# Patient Record
Sex: Male | Born: 1970 | Race: White | Hispanic: Yes | Marital: Married | Smoking: Never smoker
Health system: Southern US, Community
[De-identification: ages and names within clinical notes are randomized; demographics above are authoritative.]

## PROBLEM LIST (undated history)

## (undated) HISTORY — PX: APPENDECTOMY: SHX54

---

## 2006-09-26 ENCOUNTER — Inpatient Hospital Stay (HOSPITAL_COMMUNITY): Admission: EM | Admit: 2006-09-26 | Discharge: 2006-09-29 | Payer: Self-pay | Admitting: Emergency Medicine

## 2013-08-26 ENCOUNTER — Emergency Department (HOSPITAL_COMMUNITY)
Admission: EM | Admit: 2013-08-26 | Discharge: 2013-08-26 | Disposition: A | Discharge status: Home / Self Care | Payer: Self-pay | Attending: Emergency Medicine | Admitting: Emergency Medicine

## 2013-08-26 ENCOUNTER — Encounter (HOSPITAL_COMMUNITY): Payer: Self-pay | Admitting: Emergency Medicine

## 2013-08-26 DIAGNOSIS — K2970 Gastritis, unspecified, without bleeding: Secondary | ICD-10-CM | POA: Insufficient documentation

## 2013-08-26 DIAGNOSIS — Z79899 Other long term (current) drug therapy: Secondary | ICD-10-CM | POA: Insufficient documentation

## 2013-08-26 DIAGNOSIS — K2990 Gastroduodenitis, unspecified, without bleeding: Principal | ICD-10-CM

## 2013-08-26 DIAGNOSIS — Z9889 Other specified postprocedural states: Secondary | ICD-10-CM | POA: Insufficient documentation

## 2013-08-26 LAB — CBC WITH DIFFERENTIAL/PLATELET
Basophils Absolute: 0 10*3/uL (ref 0.0–0.1)
Basophils Relative: 0 % (ref 0–1)
EOS ABS: 0.1 10*3/uL (ref 0.0–0.7)
Eosinophils Relative: 2 % (ref 0–5)
HCT: 44 % (ref 39.0–52.0)
HEMOGLOBIN: 15.9 g/dL (ref 13.0–17.0)
LYMPHS ABS: 2.2 10*3/uL (ref 0.7–4.0)
LYMPHS PCT: 29 % (ref 12–46)
MCH: 29.3 pg (ref 26.0–34.0)
MCHC: 36.1 g/dL — ABNORMAL HIGH (ref 30.0–36.0)
MCV: 81 fL (ref 78.0–100.0)
MONOS PCT: 9 % (ref 3–12)
Monocytes Absolute: 0.7 10*3/uL (ref 0.1–1.0)
NEUTROS PCT: 61 % (ref 43–77)
Neutro Abs: 4.7 10*3/uL (ref 1.7–7.7)
Platelets: 199 10*3/uL (ref 150–400)
RBC: 5.43 MIL/uL (ref 4.22–5.81)
RDW: 13.1 % (ref 11.5–15.5)
WBC: 7.7 10*3/uL (ref 4.0–10.5)

## 2013-08-26 LAB — COMPREHENSIVE METABOLIC PANEL
ALK PHOS: 81 U/L (ref 39–117)
ALT: 29 U/L (ref 0–53)
AST: 29 U/L (ref 0–37)
Albumin: 4.3 g/dL (ref 3.5–5.2)
BILIRUBIN TOTAL: 0.5 mg/dL (ref 0.3–1.2)
BUN: 21 mg/dL (ref 6–23)
CHLORIDE: 98 meq/L (ref 96–112)
CO2: 29 meq/L (ref 19–32)
Calcium: 9.5 mg/dL (ref 8.4–10.5)
Creatinine, Ser: 0.81 mg/dL (ref 0.50–1.35)
GLUCOSE: 100 mg/dL — AB (ref 70–99)
POTASSIUM: 4.2 meq/L (ref 3.7–5.3)
Sodium: 140 mEq/L (ref 137–147)
Total Protein: 8 g/dL (ref 6.0–8.3)

## 2013-08-26 LAB — URINALYSIS, ROUTINE W REFLEX MICROSCOPIC
BILIRUBIN URINE: NEGATIVE
Glucose, UA: NEGATIVE mg/dL
HGB URINE DIPSTICK: NEGATIVE
KETONES UR: NEGATIVE mg/dL
Leukocytes, UA: NEGATIVE
NITRITE: NEGATIVE
Protein, ur: NEGATIVE mg/dL
Specific Gravity, Urine: 1.023 (ref 1.005–1.030)
UROBILINOGEN UA: 0.2 mg/dL (ref 0.0–1.0)
pH: 6 (ref 5.0–8.0)

## 2013-08-26 LAB — LIPASE, BLOOD: LIPASE: 32 U/L (ref 11–59)

## 2013-08-26 MED ORDER — FAMOTIDINE 20 MG PO TABS: 20.0000 mg | ORAL_TABLET | Freq: Two times a day (BID) | ORAL | Status: DC

## 2013-08-26 MED ORDER — FAMOTIDINE 20 MG PO TABS
20.0000 mg | ORAL_TABLET | Freq: Once | ORAL | Status: AC
  Administered 2013-08-26: 20 mg via ORAL
  Filled 2013-08-26: qty 1

## 2013-08-26 NOTE — ED Notes (Addendum)
Last ate 1800, last BM 4 hrs ago (normal). C/o LUQ pain and also some back pain. (Denies: nv, dizziness, fever, HA, sob, CP or other sx), alert, NAD, calm, interactive. Mentions having had appendix removed (added to hx). Lab results reviewed.

## 2013-08-26 NOTE — ED Notes (Signed)
The pt  C/o generalized mid abd pain for 6 hours no nv or diarrhea

## 2013-08-26 NOTE — ED Provider Notes (Signed)
CSN: [id number]     Arrival date & time 08/26/13  0048 History   First MD Initiated Contact with Patient 08/26/13 [phone]     Chief Complaint  Patient presents with  . Abdominal Pain     (Consider location/radiation/quality/duration/timing/severity/associated sxs/prior Treatment) HPI Comments: 43 year old male, complains of epigastric pain which started approximately 5 hours prior to my evaluation. This was gradual in onset, persistent, radiates to the mid back, burning in sensation, nothing makes this better or worse. He had similar symptoms a couple of months ago which resolved spontaneously but only lasted 2 hours at that time. He denies nausea, vomiting, diarrhea, dysuria, swelling. He had an appendectomy several years ago which was uncomplicated laparoscopic procedure. At this time his symptoms are gradually improving and had almost completely resolved. He states that he had a grilled fish for dinner, he does not usually get pain with eating  Patient is a 43 y.o. male presenting with abdominal pain. The history is provided by the patient.  Abdominal Pain   History reviewed. No pertinent past medical history. Past Surgical History  Procedure Laterality Date  . Appendectomy     No family history on file. History  Substance Use Topics  . Smoking status: Never Smoker   . Smokeless tobacco: Not on file  . Alcohol Use: No    Review of Systems  Gastrointestinal: Positive for abdominal pain.  All other systems reviewed and are negative.      Allergies  Review of patient's allergies indicates no known allergies.  Home Medications   Current Outpatient Rx  Name  Route  Sig  Dispense  Refill  . acetaminophen (TYLENOL) 500 MG tablet   Oral   Take 1,000 mg by mouth every 6 (six) hours as needed for moderate pain.         . famotidine (PEPCID) 20 MG tablet   Oral   Take 1 tablet (20 mg total) by mouth 2 (two) times daily.   30 tablet   0    BP 128/84  Pulse 61  Temp(Src)  97.9 F (36.6 C) (Oral)  Resp 18  Ht 5\' 6"  (1.676 m)  Wt 172 lb 9.6 oz (78.291 kg)  BMI 27.87 kg/m2  SpO2 99% Physical Exam  Nursing note and vitals reviewed. Constitutional: He appears well-developed and well-nourished. No distress.  HENT:  Head: Normocephalic and atraumatic.  Mouth/Throat: Oropharynx is clear and moist. No oropharyngeal exudate.  Eyes: Conjunctivae and EOM are normal. Pupils are equal, round, and reactive to light. Right eye exhibits no discharge. Left eye exhibits no discharge. No scleral icterus.  Neck: Normal range of motion. Neck supple. No JVD present. No thyromegaly present.  Cardiovascular: Normal rate, regular rhythm, normal heart sounds and intact distal pulses.  Exam reveals no gallop and no friction rub.   No murmur heard. Pulmonary/Chest: Effort normal and breath sounds normal. No respiratory distress. He has no wheezes. He has no rales.  Abdominal: Soft. Bowel sounds are normal. He exhibits no distension and no mass. There is tenderness ( Minimal epigastric and left upper quadrant tenderness, no distention, no guarding, no other tenderness including right upper quadrant.).  Musculoskeletal: Normal range of motion. He exhibits no edema and no tenderness.  Lymphadenopathy:    He has no cervical adenopathy.  Neurological: He is alert. Coordination normal.  Skin: Skin is warm and dry. No rash noted. No erythema.  Psychiatric: He has a normal mood and affect. His behavior is normal.    ED Course  Procedures (including critical care time) Labs Review Labs Reviewed  CBC WITH DIFFERENTIAL - Abnormal; Notable for the following:    MCHC 36.1 (*)    All other components within normal limits  COMPREHENSIVE METABOLIC PANEL - Abnormal; Notable for the following:    Glucose, Bld 100 (*)    All other components within normal limits  LIPASE, BLOOD  URINALYSIS, ROUTINE W REFLEX MICROSCOPIC   Imaging Review No results found.    MDM   Final diagnoses:   Gastritis    The patient is very well-appearing, normal GU exam, abdominal exam is benign and likely consistent with a gastritis or early peptic ulcer. His vital signs are normal, laboratory workup shows normal white blood cell count at 7700, normal cover his metabolic panel and normal lipase. The patient will be started on Pepcid, given precautions against using anti-inflammatory medications, followup encouraged her family doctor. Patient expresses understanding.   Meds given in ED:  Medications  famotidine (PEPCID) tablet 20 mg (not administered)    New Prescriptions   FAMOTIDINE (PEPCID) 20 MG TABLET    Take 1 tablet (20 mg total) by mouth 2 (two) times daily.        Vida Roller, MD 08/26/13 [phone]

## 2013-08-26 NOTE — Discharge Instructions (Signed)
°Emergency Department Resource Guide °1) Find a Doctor and Pay Out of Pocket °Although you won't have to find out who is covered by your insurance plan, it is a good idea to ask around and get recommendations. You will then need to call the office and see if the doctor you have chosen will accept you as a new patient and what types of options they offer for patients who are self-pay. Some doctors offer discounts or will set up payment plans for their patients who do not have insurance, but you will need to ask so you aren't surprised when you get to your appointment. ° °2) Contact Your Local Health Department °Not all health departments have doctors that can see patients for sick visits, but many do, so it is worth a call to see if yours does. If you don't know where your local health department is, you can check in your phone book. The CDC also has a tool to help you locate your state's health department, and many state websites also have listings of all of their local health departments. ° °3) Find a Walk-in Clinic °If your illness is not likely to be very severe or complicated, you may want to try a walk in clinic. These are popping up all over the country in pharmacies, drugstores, and shopping centers. They're usually staffed by nurse practitioners or physician assistants that have been trained to treat common illnesses and complaints. They're usually fairly quick and inexpensive. However, if you have serious medical issues or chronic medical problems, these are probably not your best option. ° °No Primary Care Doctor: °- Call [facility] at  832-8000 - they can help you locate a primary care doctor that  accepts your insurance, provides certain services, etc. °- Physician Referral Service- 1-800-533-3463 ° °Chronic Pain Problems: °Organization         Address  Phone   Notes  °[facility]  (336) 297-2271 Patients need to be referred by their primary care doctor.  ° °Medication  Assistance: °Organization         Address  Phone   Notes  °[place] [facility] [address] °[place], [place] [address] (336) 641-8030 --Must be a resident of [place] °-- Must have NO insurance coverage whatsoever (no Medicaid/ Medicare, etc.) °-- The pt. MUST have a primary care doctor that directs their care regularly and follows them in the community °  °[facility]  (866) 331-1348   °[employer]  (888) 892-1162   ° °Agencies that provide inexpensive medical care: °Organization         Address  Phone   Notes  °[facility]  (336) 832-8035   °[facility]    (336) 832-7272   °[facility] [address] °[place], [place] [address] (336) 832-4777   °[facility] [address], °[place] (336) 271-4999   °[facility]    (336) 373-0678   °[facility]    (336) 272-1050   °[facility] ° [address], [place] Phone:  (336) 832-4444, Fax:  (336) 832-4440 Hours of Operation:  9 am - 6 pm, M-F.  Also accepts Medicaid/Medicare and self-pay.  °[facility] ° [address],  Phone: (336) 832-3150, Fax: (336) [phone]. Hours of Operation:  8:30 am - 5:30 pm, M-F.  Also accepts Medicaid and self-pay.  °[facility] [place] [address], [place] Phone: (336) 878-6027   °[facility] [address], [place], [place] (336)723-1848, Ext. 123 Mondays & Thursdays: 7-9 AM.  First 15 patients are seen on a first come, first serve basis. °  ° °Medicaid-accepting [place] Providers: ° °Organization         Address  Phone   Notes  °[facility] [address], [place] (336) 641-2100 Also accepts self-pay patients.  °[facility] [address], [place] ° (336) 856-9996   °[facility] [address], [place]  (336) 288-8857   °[facility] [address] [place] [address], [place] (336) 299-7000   °Veita Bland [address], [place]  ° (336) 373-1557 Only accepts [place] Access Medicaid patients after they have their name applied to their card.  ° °Self-Pay (no insurance) in [place]: ° °Organization         Address  Phone   Notes  °Sickle Cell Patients, [facility] [address], [place] (336) 832-1970   °[facility] [address], [place] (336) 832-4400   °[facility] ° [address],  (336) 992-4800   °[facility]/Dr. Osei-Bonsu ° [address] [place] [address], [place] or [address], [place] (336) 841-8500 Phone number for both [place] and [place] locations is the same.  °[facility] [address], [place] (336) 299-0000   °[facility] [address] [place] [address], [place] or [address] (336) 852-7530 °(336) 878-2260   °[facility] [address] [place] [address], [place] (336) 350-1642, phone; (336) 294-5005, fax Sees patients 1st and 3rd Saturday of every month.  Must not qualify for public or private insurance (i.e. Medicaid, Medicare, [place] Health Choice, Veterans' Benefits) • Household income should be no more than 200% of the poverty level •The clinic cannot treat you if you are pregnant or think you are pregnant • Sexually transmitted diseases are not treated at the clinic.  ° ° °Dental Care: °Organization         Address  Phone  Notes  °[place] [facility] [address], [place] (336) 641-6152 Accepts children up to age 21 who are enrolled in Medicaid or [place] Health Choice; pregnant women with a Medicaid card; and children who have applied for Medicaid or [place] Health Choice, but were declined, whose parents can pay a reduced fee at time of service.  °[place]  [facility] [place]  [address], [place] (336) 641-7733 Accepts children up to age 21 who are enrolled in Medicaid or [place] Health Choice; pregnant women with a Medicaid card; and children who have applied for Medicaid or [place] Health Choice, but were declined, whose parents can pay a reduced fee at time of service.  °[facility] ° [address], [place] (336) 641-4533 Patients are seen by appointment only. Walk-ins are not accepted. [facility] will see patients 18 years of age and older. °Monday - Tuesday (8am-5pm) °Most Wednesdays (8:30-5pm) °$30 per visit, cash only  °[facility] ° [address], [place] (336) 641-4533 Patients are seen by appointment only. Walk-ins are not accepted. [facility] will see patients 18 years of age and older. °One   Wednesday Evening (Monthly: Volunteer Based).  $30 per visit, cash only  °[facility]  (919) 537-3737 for adults; Children under age 4, call Graduate Pediatric Dentistry at ([phone]) [phone]. Children aged 4-14, please call (919) [phone] to request a pediatric application. ° Dental services are provided in all areas of dental care including fillings, crowns and bridges, complete and partial dentures, implants, gum treatment, root canals, and extractions. Preventive care is also provided. Treatment is provided to both adults and children. °Patients are selected via a lottery and there is often a waiting list. °  °[facility] [address], °[place] ° (336) 763-8833 [email or web address] °  °[facility] [address], [place], [place] (336)723-1848, Ext. 123 Second and Fourth Thursday of each month, opens at 6:30 AM; Clinic ends at 9 AM.  Patients are seen on a first-come first-served basis, and a limited number are seen during each clinic.  ° °[facility] ° [address], [place], [place] (336) 723-7904    Eligibility Requirements °You must have lived in [place], [place], or [place] counties for at least the last three months. °  You cannot be eligible for state or federal sponsored healthcare insurance, including Veterans Administration, Medicaid, or Medicare. °  You generally cannot be eligible for healthcare insurance through your employer.  °  How to apply: °Eligibility screenings are held every Tuesday and Wednesday afternoon from 1:00 pm until 4:00 pm. You do not need an appointment for the interview!  °[facility] [address], [place], [place] 336-631-2330   °[facility]  336-342-8273   °[facility]  336-703-3100   °[facility]  336-570-6415   ° °Behavioral Health Resources in the Community: °Intensive Outpatient Programs °Organization         Address  Phone  Notes  °[facility] [address], [place], [place] 336-878-6098   °[facility] [address], [place], [place] 336-832-9800   °[facility] [address], [place], [place] ° 336-882-2125   °[facility] [address],  °[place], [place] 1-800-853-5163 or 336-641-4981   °Substance Abuse Resources °Organization         Address  Phone  Notes  °[facility]  336-882-2125   °[facility]  336-784-9470   °[facility]  336-285-9073   °[facility]  336-845-3988   °Residential & Outpatient Substance Abuse Program  1-800-659-3381   °Psychological Services °Organization         Address  Phone  Notes  °[facility]  336- 832-9600   °[facility]  336- 378-7881   °[facility] [address], [place] 1-800-853-5163 or 336-641-4981   ° °Mobile Crisis Teams °Organization         Address  Phone  Notes  °[facility], Mobile Crisis Care Unit  1-877-626-1772   °Assertive °[facility] ° [address]  [place], [place] 336-834-9664   °Sharon DeEsch [address] °[place] 336-554-5454   ° °Self-Help/Support Groups °Organization         Address  Phone             Notes  °[facility]  - variety of support groups  336- 373-1402 Call for more information  °Narcotics Anonymous (NA), [facility] [address], °[place]  2 meetings at this location  ° °  Residential Treatment Programs °Organization         Address  Phone  Notes  °[facility] [address],    °[place]  1-866-801-8205   °[facility] ° [address], [place], [place] 704-293-8524   °[facility] [address], [place] 336-845-3988 Admissions: 8am-3pm M-F  °[facility] [address]    °[place], [place] 336-841-1104   °[facility] [address], [place], [place] 336-379-7146   °[facility] [address]  °[place], [place] 336-285-9073   °[facility] - Intensive Outpatient [address], [place], [place] 336-852-3033   °[facility] [address]  °[place], [place] 1-877-615-2722 or 336-784-9470   °[facility] ([facility] [address] , [place] 336-227-7417 Accepts Medicaid  °[facility] [address]  °[place] 1-800-659-3381 Substance Abuse/Addiction Treatment  ° °[place] Behavioral Health Resources °Organization         Address  Phone  Notes  °[facility]  (888) 581-9988   °Julie Brannon, PhD [address] [place], [place]   (336) 349-5553 or (336) 951-0000   °[facility]   [address] °[place], [place] (336) 349-4454   °[facility] [address], [place], [place] (336) 342-8316 Insurance/Medicaid/sponsorship through [facility]  °[facility] [address]                                    [place], [place] (336) 342-8316 Therapy/tele-psych/case    °[facility] [address]  ° [place], [place] (336) 349-2233    °Dr. Arfeen  (336) 349-4544   °[facility] [place]  [employer] [place] [facility] 1) [address], [place] °2) [address], [place] °3)  [address], [place] (336) 349-3220 °(336) 342-7768 ° °(336) 342-8140   °[place] [facility] (336) 342-1394 or (336) 342-3537 (After Hours)    ° ° °

## 2015-04-27 ENCOUNTER — Emergency Department (HOSPITAL_COMMUNITY)
Admission: EM | Admit: 2015-04-27 | Discharge: 2015-04-27 | Disposition: A | Discharge status: Home / Self Care | Payer: Self-pay | Source: Home / Self Care | Attending: Family Medicine | Admitting: Family Medicine

## 2015-04-27 ENCOUNTER — Emergency Department (INDEPENDENT_AMBULATORY_CARE_PROVIDER_SITE_OTHER): Payer: Self-pay

## 2015-04-27 ENCOUNTER — Encounter (HOSPITAL_COMMUNITY): Payer: Self-pay | Admitting: Emergency Medicine

## 2015-04-27 DIAGNOSIS — S2232XA Fracture of one rib, left side, initial encounter for closed fracture: Secondary | ICD-10-CM

## 2015-04-27 DIAGNOSIS — W108XXA Fall (on) (from) other stairs and steps, initial encounter: Secondary | ICD-10-CM

## 2015-04-27 DIAGNOSIS — R0781 Pleurodynia: Secondary | ICD-10-CM

## 2015-04-27 MED ORDER — IBUPROFEN 800 MG PO TABS: 800.0000 mg | ORAL_TABLET | Freq: Three times a day (TID) | ORAL | Status: DC

## 2015-04-27 MED ORDER — HYDROCODONE-ACETAMINOPHEN 5-325 MG PO TABS: 1.0000 | ORAL_TABLET | ORAL | Status: DC | PRN

## 2015-04-27 NOTE — Discharge Instructions (Signed)
Traumatismo Torcico Contuso (Blunt Chest Trauma)  El traumatismo torcico contuso es una lesin causada por un golpe en el pecho. Estas lesiones suelen ser muy dolorosas. Generalmente resulta en un hematoma o en costillas rotas (fracturadas). La mayor parte de los hematomas y las fracturas de costillas por traumatismos torcicos contusos mejoran despus de 1 a 3 semanas de reposo y uso de medicamentos para Chief Technology Officer. Generalmente, los tejidos blandos de la pared torcica tambin se lesionan, lo que produce dolor y hematomas. Los rganos internos, como el corazn y los pulmones, tambin pueden sufrir lesiones. El traumatismo torcico contuso puede producir problemas mdicos graves. Este tipo de lesin requiere atencin mdica inmediata.  CAUSAS   Colisiones en vehculos de motor.  Cadas.  Violencia fsica.  Lesiones deportivas. SNTOMAS   Dolor en el pecho. El dolor puede empeorar al moverse o respirar profundamente.  Falta de aire.  Aturdimiento.  Hematomas.  Sensibilidad.  Hinchazn. DIAGNSTICO  El mdico le har un examen fsico. Podrn tomarle radiografas para comprobar si hay fracturas. Sin embargo, las fracturas pequeas en las costillas pueden no aparecer en las radiografas hasta unos das despus de la lesin. Si se sospecha una lesin ms grave, podrn indicarle otras pruebas de diagnstico por imgenes. Puede incluir ecografas, tomografa computada (TC) o resonancia magntica (IMR).  TRATAMIENTO  El tratamiento depende de la gravedad de la lesin. El mdico podr recetarle medicamentos para Primary school teacher e indicarle ejercicios de respiracin profunda.  INSTRUCCIONES PARA EL CUIDADO EN EL HOGAR   Limite sus actividades hasta que pueda moverse sin sentir Scientist, forensic.  No realice trabajos extenuantes hasta que la lesin se haya curado.  Aplique hielo sobre la zona lesionada.  Ponga el hielo en una bolsa plstica.  Colquese una toalla entre la piel y la bolsa  de hielo.  Deje el hielo durante 15 a 20 minutos, 3 a 4 veces por da.  Podr utilizar una faja para las costillas para reducir Chief Technology Officer segn le hayan indicado.  Realice inspiraciones, profundas segn las indicaciones del mdico, para mantener los pulmones limpios.  Slo tome medicamentos de venta libre o recetados para Primary school teacher, la fiebre, o el [place], segn las indicaciones de su mdico. SOLICITE ATENCIN MDICA DE [employer] SI:  Siente falta de aire o dolor en el pecho cada vez ms intensos.  Tose y escupe sangre.  Tiene nuseas, vmitos o dolor abdominal.  Tiene fiebre.  Se siente mareado, dbil o se desmaya. ASEGRESE DE QUE:   Comprende estas instrucciones.  Controlar su enfermedad.  Solicitar ayuda de inmediato si no mejora o si empeora.   Esta informacin no tiene Theme park manager el consejo del mdico. Asegrese de hacerle al mdico cualquier pregunta que tenga.   Document Released: 05/16/2005 Document Revised: 08/08/2011 Elsevier Interactive Patient Education 2016 [employer].  Kara Pacer de costilla  (Rib Fracture)  Darren Hahn de costilla es la ruptura de uno de los huesos que la forman. Las costillas son un grupo de huesos largos y curvos que rodean el pecho y estn adheridos a la columna vertebral. Protegen a los pulmones y a otros rganos que se encuentran en la cavidad torcica. La fractura o fisura de una costilla puede ser dolorosa pero no causa otros problemas. La mayora de las fracturas de costillas se curan por s mismas luego de un tiempo. Sin embargo, Producer, television/film/video a ser ms grave si se rompen varias costillas o si se desplazan y Education administrator. CAUSAS   Un golpe directo  en el pecho. Por ejemplo, esto puede ocurrir Fiservdurante la prctica de deportes de Pierre Partcontacto, un accidente automovilstico o una cada sobre un [place]obj[place] duro.  Los movimientos repetitivos con una gran fuerza, como al lanzar una pelota en el bisbol, o tener  episodios de tos intensos. SNTOMAS   Dolor al respirar o al toser.  Dolor cuando alguien presiona la zona lesionada. DIAGNSTICO  El Office Depotmdico le har un examen fsico. Le indicar diferentes estudios por imgenes para confirmar el diagnstico y buscar lesiones relacionadas. Estos estudios incluyen radiografas de trax, tomografa computada (TC), resonancia magntica (MRI) o gammagrafa sea.  TRATAMIENTO  La mayora de las fracturas de costillas se curan por s mismas en 1 a 3 meses. Los perodos de curacin ms prolongados generalmente se asocian a tos continua o a otras actividades que agravan el problema. Durante el perodo de curacin es muy importante el control del dolor. Generalmente se recetan medicamentos para Human resources officercontrolar el dolor. Ser necesaria la hospitalizacin o una ciruga si hay lesiones ms graves, como las fracturas de mltiples costillas o aquellas en las que las costillas se desplazan.  INSTRUCCIONES PARA EL CUIDADO EN EL HOGAR   Evite las actividades extenuantes y toda [place] Grenadinesactividad o movimiento que le cause dolor. Realice las actividades con cuidado y evite golpearse las costillas lesionadas.  Reanude la actividad sexual cuando le indique su mdico.  Tome slo medicamentos de venta libre o recetados, segn las indicaciones del mdico. No tome otros medicamentos sin Science writerconsultar a su mdico.  Aplique hielo en la zona Cox Communicationslesionada durante los primeros 1 a 2 das despus de haber recibido tratamiento o segn las indicaciones de su mdico. La aplicacin del hielo ayuda a reducir la inflamacin y Chief Technology Officerel dolor.  Ponga el hielo en una bolsa plstica.  Colquese una toalla entre la piel y la bolsa de hielo.   Deje el hielo durante 15 a 20 minutos, cada 2 horas mientras se encuentre despierto.  Haga ejercicios de Arts development officerrespiracin como le indique su mdico. Esto ayudar a evitar la neumona, que es una complicacin frecuente en las fracturas de [place]. El mdico le indicar que:  Haga  respiraciones profundas varias veces al da.  Trate de toser Dover Corporationvarias veces al da, sosteniendo el rea lesionada con [place]una almohada.  Use un dispositivo llamado espirmetro incentivo para realizar respiraciones profundas varias veces por da.  Beba suficiente lquido para Photographermantener la orina clara o de color amarillo plido. Esto le ayudar a Chief Strategy Officerevitar el estreimiento.   No use cinturones ni sujetadores. Esta limitacin de la respiracin puede causar neumona.  SOLICITE ATENCIN MDICA DE INMEDIATO SI:   Tiene fiebre.  Tiene dificultad para respirar o Company secretaryle falta el aire.   Tiene tos continua o elimina moco espeso o sanguinolento.  Tiene Programme researcher, broadcasting/film/videomalestar estomacal (nuseas), devuelve (vomita), o tiene dolor abdominal.   El dolor aumenta y no se alivia con los medicamentos.  ASEGRESE DE QUE:   Comprende estas instrucciones.  Controlar su enfermedad.  Recibir ayuda de inmediato si no mejora o si empeora.   Esta informacin no tiene Theme park managercomo fin reemplazar el consejo del mdico. Asegrese de hacerle al mdico cualquier pregunta que tenga.   Document Released: 02/23/2005 Document Revised: 01/16/2013 Elsevier Interactive Patient Education [employer]2016 Elsevier Inc.

## 2015-04-27 NOTE — ED Provider Notes (Signed)
CSN: [id number]     Arrival date & time 04/27/15  1435 History   First MD Initiated Contact with Patient 04/27/15 1637     Chief Complaint  Patient presents with  . Rib Injury   (Consider location/radiation/quality/duration/timing/severity/associated sxs/prior Treatment) HPI Comments: 44 year old male states that 3 days ago he was descending the steps and fell. He  struck the leftlower posterior lateral chest on the steps when he fell. This is caused localized pain. Worse with movement and breathing. He denies shortness of breath or cough. Denies injury to the head, neck, upper back, spine or abdomen. He is awake, ambulatory in no acute distress.    History reviewed. No pertinent past medical history. Past Surgical History  Procedure Laterality Date  . Appendectomy     No family history on file. Social History  Substance Use Topics  . Smoking status: Never Smoker   . Smokeless tobacco: None  . Alcohol Use: No    Review of Systems  Constitutional: Negative.   HENT: Negative.   Respiratory: Negative.  Negative for cough, shortness of breath and wheezing.   Cardiovascular: Negative.   Genitourinary: Negative.   Musculoskeletal: Positive for back pain. Negative for joint swelling, neck pain and neck stiffness.  Skin: Positive for wound.  Neurological: Negative.   All other systems reviewed and are negative.   Allergies  Review of patient's allergies indicates no known allergies.  Home Medications   Prior to Admission medications   Medication Sig Start Date End Date Taking? Authorizing Provider  acetaminophen (TYLENOL) 500 MG tablet Take 1,000 mg by mouth every 6 (six) hours as needed for moderate pain.    Historical Provider, MD  famotidine (PEPCID) 20 MG tablet Take 1 tablet (20 mg total) by mouth 2 (two) times daily. 08/26/13   Eber Hong, MD  HYDROcodone-acetaminophen (NORCO/VICODIN) 5-325 MG tablet Take 1 tablet by mouth every 4 (four) hours as needed. 04/27/15   Hayden Rasmussen, NP  ibuprofen (ADVIL,MOTRIN) 800 MG tablet Take 1 tablet (800 mg total) by mouth 3 (three) times daily. 04/27/15   Hayden Rasmussen, NP   Meds Ordered and Administered this Visit  Medications - No data to display  BP 112/72 mmHg  Pulse 65  Temp(Src) 97.7 F (36.5 C) (Oral)  Resp 16  SpO2 97% No data found.   Physical Exam  Constitutional: He is oriented to person, place, and time. He appears well-developed and well-nourished. No distress.  Neck: Normal range of motion. Neck supple.  Cardiovascular: Normal rate, regular rhythm and normal heart sounds.   Pulmonary/Chest: Effort normal and breath sounds normal. No respiratory distress. He has no wheezes. He has no rales.  There is tenderness over the left lateral, posterior lateral rib cage primarily over the ninth and 10th ribs. There is an overlying superficial abrasion to the same area. No rib mobility detected with palpation. No other back tenderness. No spinal tenderness, deformity or swelling. Normal spinal flexion.  Abdominal: Soft. There is no tenderness.  Musculoskeletal: Normal range of motion. He exhibits no edema.  Neurological: He is alert and oriented to person, place, and time. He exhibits normal muscle tone.  Skin: Skin is warm and dry. No rash noted.  Psychiatric: He has a normal mood and affect.  Nursing note and vitals reviewed.   ED Course  Procedures (including critical care time)  Labs Review Labs Reviewed - No data to display  Imaging Review Dg Ribs Unilateral W/chest Left  04/27/2015  CLINICAL DATA:  Left posterior rib  pain following a fall 3 days ago. EXAM: LEFT RIBS AND CHEST - 3+ VIEW COMPARISON:  None. FINDINGS: Minimally displaced left posterior ninth rib fracture, laterally. No pneumothorax. Minimal pleural fluid on the left. Normal sized heart. Clear lungs. IMPRESSION: Minimally displaced left posterior ninth rib fracture with an associated minimal hemothorax. Electronically Signed   By: Beckie SaltsSteven  Reid  M.D.   On: 04/27/2015 17:11     Visual Acuity Review  Right Eye Distance:   Left Eye Distance:   Bilateral Distance:    Right Eye Near:   Left Eye Near:    Bilateral Near:         MDM   1. Fall down steps, initial encounter   2. Rib pain on left side   3. Closed rib fracture, left, initial encounter    norco 5 mg #15 Ibuprofen 800 mg tid prn Ice locally. Rechk for breathing problems, cough or other problems.    Hayden Rasmussenavid Eitan Doubleday, NP 04/27/15 1759

## 2015-04-27 NOTE — ED Notes (Signed)
Pt reports he slipped down 10 steps on Friday, 11/25, and inj his left rib cage Pais is 8/10 and increase w/movement or when lifting heavy objects Steady gait; NAD A&O x4... No acute distress.

## 2015-05-11 ENCOUNTER — Emergency Department (INDEPENDENT_AMBULATORY_CARE_PROVIDER_SITE_OTHER)
Admission: EM | Admit: 2015-05-11 | Discharge: 2015-05-11 | Disposition: A | Discharge status: Home / Self Care | Payer: Self-pay | Source: Home / Self Care | Attending: Family Medicine | Admitting: Family Medicine

## 2015-05-11 ENCOUNTER — Encounter (HOSPITAL_COMMUNITY): Payer: Self-pay | Admitting: Emergency Medicine

## 2015-05-11 DIAGNOSIS — R10817 Generalized abdominal tenderness: Secondary | ICD-10-CM

## 2015-05-11 DIAGNOSIS — K2970 Gastritis, unspecified, without bleeding: Secondary | ICD-10-CM

## 2015-05-11 DIAGNOSIS — R1013 Epigastric pain: Secondary | ICD-10-CM

## 2015-05-11 DIAGNOSIS — K5901 Slow transit constipation: Secondary | ICD-10-CM

## 2015-05-11 MED ORDER — SUCRALFATE 1 G PO TABS: 1.0000 g | ORAL_TABLET | Freq: Three times a day (TID) | ORAL | Status: DC

## 2015-05-11 MED ORDER — RANITIDINE HCL 150 MG PO CAPS
150.0000 mg | ORAL_CAPSULE | Freq: Two times a day (BID) | ORAL | Status: DC
Start: 2015-05-11 — End: 2021-06-30

## 2015-05-11 NOTE — Discharge Instructions (Signed)
Dolor abdominal en adultos °(Abdominal Pain, Adult) °El dolor puede tener muchas causas. Normalmente la causa del dolor abdominal no es una enfermedad y mejorará sin tratamiento. Frecuentemente puede controlarse y tratarse en casa. Su médico le realizará un examen físico y posiblemente solicite análisis de sangre y radiografías para ayudar a determinar la gravedad de su dolor. Sin embargo, en muchos casos, debe transcurrir más tiempo antes de que se pueda encontrar una causa evidente del dolor. Antes de llegar a ese punto, es posible que su médico no sepa si necesita más pruebas o un tratamiento más profundo. °INSTRUCCIONES PARA EL CUIDADO EN EL HOGAR  °Esté atento al dolor para ver si hay cambios. Las siguientes indicaciones ayudarán a aliviar cualquier molestia que pueda sentir: °· Tome solo medicamentos de venta libre o recetados, según las indicaciones del médico. °· No tome laxantes a menos que se lo haya indicado su médico. °· Pruebe con una dieta líquida absoluta (caldo, té o agua) según se lo indique su médico. Introduzca gradualmente una dieta normal, según su tolerancia. °SOLICITE ATENCIÓN MÉDICA SI: °· Tiene dolor abdominal sin explicación. °· Tiene dolor abdominal relacionado con náuseas o diarrea. °· Tiene dolor cuando orina o defeca. °· Experimenta dolor abdominal que lo despierta de noche. °· Tiene dolor abdominal que empeora o mejora cuando come alimentos. °· Tiene dolor abdominal que empeora cuando come alimentos grasosos. °· Tiene fiebre. °SOLICITE ATENCIÓN MÉDICA DE INMEDIATO SI:  °· El dolor no desaparece en un plazo máximo de 2 horas. °· No deja de (vomitar). °· El dolor se siente solo en partes del abdomen, como el lado derecho o la parte inferior izquierda del abdomen. °· Evacúa materia fecal sanguinolenta o negra, de aspecto alquitranado. °ASEGÚRESE DE QUE: °· Comprende estas instrucciones. °· Controlará su afección. °· Recibirá ayuda de inmediato si no mejora o si empeora. °  °Esta  información no tiene como fin reemplazar el consejo del médico. Asegúrese de hacerle al médico cualquier pregunta que tenga. °  °Document Released: 05/16/2005 Document Revised: 06/06/2014 °Elsevier Interactive Patient Education ©2016 Elsevier Inc. ° °Gastritis - Adultos °(Gastritis, Adult) ° La gastrittis es la irritación (inflamación) de la membrana interna del estómago. Puede ser una enfermedad de inicio súbito (aguda) o de largo plazo (crónica). Si la gastritis no se trata, puede causar sangrado y úlceras. °CAUSAS  °La gastritis se produce cuando la membrana que tapiza interiormente al estómago se debilita o se daña. Los jugos digestivos del estómago inflaman el revestimiento del estómago debilitado. El revestimiento del estómago puede debilitarse o dañarse por una infección viral o bacteriana. La infección bacteriana más común es la infección por Helicobacter pylori. También puede ser el resultado del consumo excesivo de alcohol, por el uso de ciertos medicamentos o porque hay demasiado ácido en el estómago.  °SÍNTOMAS  °En algunos casos no hay síntomas. Si se presentan síntomas, éstos pueden ser:  °· Dolor o sensación de ardor en la parte superior del abdomen. °· Náuseas. °· Vómitos. °· Sensación molesta de distensión después de comer. °DIAGNÓSTICO  °El médico puede diagnosticar gastritis según los síntomas y el examen físico. Para determinar la causa de la gastritis, el médico podrá:  °· Pedir análisis de sangre o de materia fecal para diagnosticar la presencia de la bacteria H pylori. °· Gastroscopía. Un tubo delgado y flexible (endoscopio) se pasa por el esófago hasta llegar al estómago. El endoscopio tiene una luz y una cámara en el extremo. El médico utilizará el endoscopio para observar el interior del estómago. °· Tomará   una muestra de tejido (biopsia) del estmago para examinarlo en el microscopio. TRATAMIENTO  Segn la causa de la gastritis podrn recetarle: Antibiticos, si la causa es una infeccin  bacteriana, como una infeccin por H. pylori. Anticidos o bloqueadores H2, si hay demasiado cido en el estmago. El Office Depotmdico le aconsejar que deje de tomar aspirina, ibuprofeno u otros antiinflamatorios no esteroides (AINE).  INSTRUCCIONES PARA EL CUIDADO EN EL HOGAR   Tome slo medicamentos de venta libre o recetados, segn las indicaciones del mdico.  Si le han recetado antibiticos, tmelos segn las indicaciones. Tmelos todos, aunque se sienta mejor.  Debe ingerir gran cantidad de lquido para mantener la orina de tono claro o color amarillo plido.  Evite las comidas y bebidas que [address] Avenueempeoran los [place]problemas, [place]:  [place]esota[place]ebidas con cafena o alcohlicas.  Chocolate.  Sabores a Advertising account plannermenta.  Ajo y cebolla.  Comidas muy condimentadas.  Ctricos como naranjas, limones o limas.  Alimentos que contengan tomate, como salsas, [place] y pizza.  Alimentos fritos y Lexicographergrasos.  Haga comidas pequeas durante Glass blower/designerel da en lugar de 3 comidas abundantes. SOLICITE ATENCIN MDICA DE INMEDIATO SI:   La materia fecal es negra o de color rojo oscuro.  Vomita sangre de color rojo brillante o material similar a granos de caf.  No puede retener los lquidos.  El dolor abdominal empeora.  Tiene fiebre.  No mejora luego de 1 semana.  Tiene preguntas o preocupaciones. ASEGRESE DE QUE:   Comprende estas instrucciones.  Controlar su enfermedad.  Solicitar ayuda de inmediato si no mejora o si empeora.   Esta informacin no tiene Theme park managercomo fin reemplazar el consejo del mdico. Asegrese de hacerle al mdico cualquier pregunta que tenga.   Document Released: 02/23/2005 Document Revised: 02/04/2015 Elsevier Interactive Patient Education 2016 [employer]vier Inc.  Estreimiento - Adultos (Constipation, Adult) Miralax as directed. May get the generic. Plenty of fluids and increase fiber. Estreimiento significa que una persona tiene menos de tres evacuaciones en una semana, dificultad para defecar, o que las  heces son secas, duras, o ms grandes que lo normal. A medida que envejecemos el estreimiento es ms comn. Una dieta baja en fibra, no tomar suficientes lquidos y el uso de ciertos medicamentos pueden Scientist, research (life sciences)empeorar el estreimiento.  CAUSAS   Ciertos medicamentos, como los antidepresivos, analgsicos, suplementos de hierro, anticidos y diurticos.  Algunas enfermedades, como la diabetes, el sndrome del colon irritable, enfermedad de la tiroides, o depresin.  No beber suficiente agua.  No consumir suficientes alimentos ricos en fibra.  Situaciones de estrs o viajes.  Falta de actividad fsica o de ejercicio.  Ignorar la necesidad sbita de Advertising copywriterdefecar.  Uso en exceso de laxantes. SIGNOS Y SNTOMAS   Defecar menos de tres veces por semana.  Dificultad para defecar.  Tener las heces secas y duras, o ms grandes que las normales.  Sensacin de estar lleno o hinchado.  Dolor en la parte baja del abdomen.  No sentir alivio despus de defecar. DIAGNSTICO  El mdico le har una historia clnica y un examen fsico. Pueden hacerle exmenes adicionales para el estreimiento grave. Estos estudios pueden ser:  Un radiografa con enema de bario para examinar el recto, el colon y, en algunos casos, el intestino delgado.  Una sigmoidoscopia para examinar el colon inferior.  Una colonoscopia para examinar todo el colon. TRATAMIENTO  El tratamiento depender de la gravedad del estreimiento y de la causa. Algunos tratamientos nutricionales son beber ms lquidos y comer ms alimentos ricos en fibra. El cambio en el estilo  de vida incluye hacer ejercicios de manera regular. Si estas recomendaciones para Public relations account executive dieta y en el estilo de vida no ayudan, el mdico le puede indicar el uso de laxantes de venta libre para ayudarlo a Advertising copywriter. Los medicamentos recetados se pueden prescribir si los medicamentos de venta libre no lo [place].  INSTRUCCIONES PARA EL CUIDADO EN EL HOGAR    Consuma alimentos con alto contenido de [place], como frutas, vegetales, cereales integrales y porotos.  Limite los alimentos procesados ricos en grasas y azcar, como las papas fritas, hamburguesas, galletas, dulces y refrescos.  Puede agregar un suplemento de fibra a su dieta si no obtiene lo suficiente de los alimentos.  Beba suficiente lquido para Photographer orina clara o de color amarillo plido.  Haga ejercicio regularmente o segn las indicaciones del mdico.  Vaya al bao cuando sienta la necesidad de ir. No se aguante las ganas.  Tome solo medicamentos de venta libre o recetados, segn las indicaciones del mdico. No tome otros medicamentos para el estreimiento sin consultarlo antes con su mdico. SOLICITE ATENCIN MDICA DE INMEDIATO SI:   Observa sangre brillante en las heces.  El estreimiento dura ms de 4 das o [place].  Siente dolor abdominal o rectal.  Las heces son delgadas como un lpiz.  Pierde peso de [place] inexplicable. ASEGRESE DE QUE:   Comprende estas instrucciones.  Controlar su afeccin.  Recibir ayuda de inmediato si no mejora o si empeora.   Esta informacin no tiene Theme park manager el consejo del mdico. Asegrese de hacerle al mdico cualquier pregunta que tenga.   Document Released: 06/05/2007 Document Revised: 06/06/2014 Elsevier Interactive Patient Education [employer]

## 2015-05-11 NOTE — ED Provider Notes (Signed)
CSN: [id number]     Arrival date & time 05/11/15  1259 History   First MD Initiated Contact with Patient 05/11/15 1323     Chief Complaint  Patient presents with  . Abdominal Pain   (Consider location/radiation/quality/duration/timing/severity/associated sxs/prior Treatment) HPI Comments: 44 year old male presents with a stomachache since yesterday. He describes it as a crampy type pain in there most of the time. Most of the discomfort is in the epigastrium. He also states that much of the pain is across the umbilicus. He has had no vomiting, nausea or diarrhea. He states eating sometimes makes him feel worse and causes more cramping. Nothing makes it better. Last week he was seen by this examiner with the diagnosis of rib fracture. He was treated with ibuprofen and Norco. Although he states he has had bowel movements daily he feels as though he may not have completely emptied out. He takes the ibuprofen 800 mg once in the morning. He does not take it any other time during the day.  Patient is a 44 y.o. male presenting with abdominal pain.  Abdominal Pain Associated symptoms: no constipation, no cough, no diarrhea, no fever, no nausea, no shortness of breath and no vomiting     History reviewed. No pertinent past medical history. Past Surgical History  Procedure Laterality Date  . Appendectomy     History reviewed. No pertinent family history. Social History  Substance Use Topics  . Smoking status: Never Smoker   . Smokeless tobacco: None  . Alcohol Use: No    Review of Systems  Constitutional: Positive for activity change and appetite change. Negative for fever.  HENT: Negative.   Respiratory: Negative.  Negative for cough and shortness of breath.   Cardiovascular: Negative.   Gastrointestinal: Positive for abdominal pain. Negative for nausea, vomiting, diarrhea and constipation.  Genitourinary: Negative.   Skin: Negative.   Neurological: Negative.   All other systems reviewed  and are negative.   Allergies  Review of patient's allergies indicates no known allergies.  Home Medications   Prior to Admission medications   Medication Sig Start Date End Date Taking? Authorizing Provider  acetaminophen (TYLENOL) 500 MG tablet Take 1,000 mg by mouth every 6 (six) hours as needed for moderate pain.    Historical Provider, MD  famotidine (PEPCID) 20 MG tablet Take 1 tablet (20 mg total) by mouth 2 (two) times daily. 08/26/13   Eber Hong, MD  HYDROcodone-acetaminophen (NORCO/VICODIN) 5-325 MG tablet Take 1 tablet by mouth every 4 (four) hours as needed. 04/27/15   Hayden Rasmussen, NP  ibuprofen (ADVIL,MOTRIN) 800 MG tablet Take 1 tablet (800 mg total) by mouth 3 (three) times daily. 04/27/15   Hayden Rasmussen, NP  ranitidine (ZANTAC) 150 MG capsule Take 1 capsule (150 mg total) by mouth 2 (two) times daily. 05/11/15   Hayden Rasmussen, NP  sucralfate (CARAFATE) 1 G tablet Take 1 tablet (1 g total) by mouth 4 (four) times daily -  with meals and at bedtime. 05/11/15   Hayden Rasmussen, NP   Meds Ordered and Administered this Visit  Medications - No data to display  BP 102/71 mmHg  Pulse 62  Temp(Src) 99 F (37.2 C) (Oral)  Resp 16  SpO2 99% No data found.   Physical Exam  Constitutional: He is oriented to person, place, and time. He appears well-developed and well-nourished. No distress.  Neck: Normal range of motion. Neck supple.  Cardiovascular: Normal rate and regular rhythm.   Pulmonary/Chest: Effort normal and breath sounds normal.  Abdominal: Soft. Bowel sounds are normal. He exhibits no distension. There is no rebound.  There is mild tenderness in the epigastrium, right upper quadrant, right hemiabdomen and left mid abdomen. The epigastric percusses tympanic. Lower abdomen percusses flat to dull.  Musculoskeletal: He exhibits no edema.  Neurological: He is oriented to person, place, and time. He exhibits normal muscle tone. Coordination normal.  Skin: Skin is warm and dry.  He is not diaphoretic.  Psychiatric: He has a normal mood and affect.  Nursing note and vitals reviewed.   ED Course  Procedures (including critical care time)  Labs Review Labs Reviewed - No data to display  Imaging Review No results found.   Visual Acuity Review  Right Eye Distance:   Left Eye Distance:   Bilateral Distance:    Right Eye Near:   Left Eye Near:    Bilateral Near:         MDM   1. Epigastric pain   2. Generalized abdominal tenderness without rebound tenderness   3. Gastritis   4. Slow transit constipation    Patient does not have an acute abdomen. No peritoneal signs. Likely the ibuprofen he is taking is causing some gastritis. I believe the bulk of his pain is coming from stool retention and constipation secondary to the hydrocodone. Zantac 150 twice a day Carafate 1 4 times a day for 10 days Stop taking the NSAIDs ibuprofen MiraLAX daily as directed for his long as needed. For worsening new symptoms or problems recommend going to the emergency department.    Hayden Rasmussenavid Angelie Kram, NP 05/11/15 1348

## 2015-05-11 NOTE — ED Notes (Signed)
The patient presented to the [facility] with a complaint of upper center abdominal pain that started yesterday. The patient denied any N/V/D

## 2015-05-20 ENCOUNTER — Ambulatory Visit: Payer: Self-pay | Attending: Family Medicine

## 2017-07-20 IMAGING — DX DG RIBS W/ CHEST 3+V*L*
3 series · 3 of 3 positions shown · non-contrast
Comparison: None.

CLINICAL DATA: Left posterior rib pain following a fall 3 days ago.

EXAM:
LEFT RIBS AND CHEST - 3+ VIEW

[chest pa]
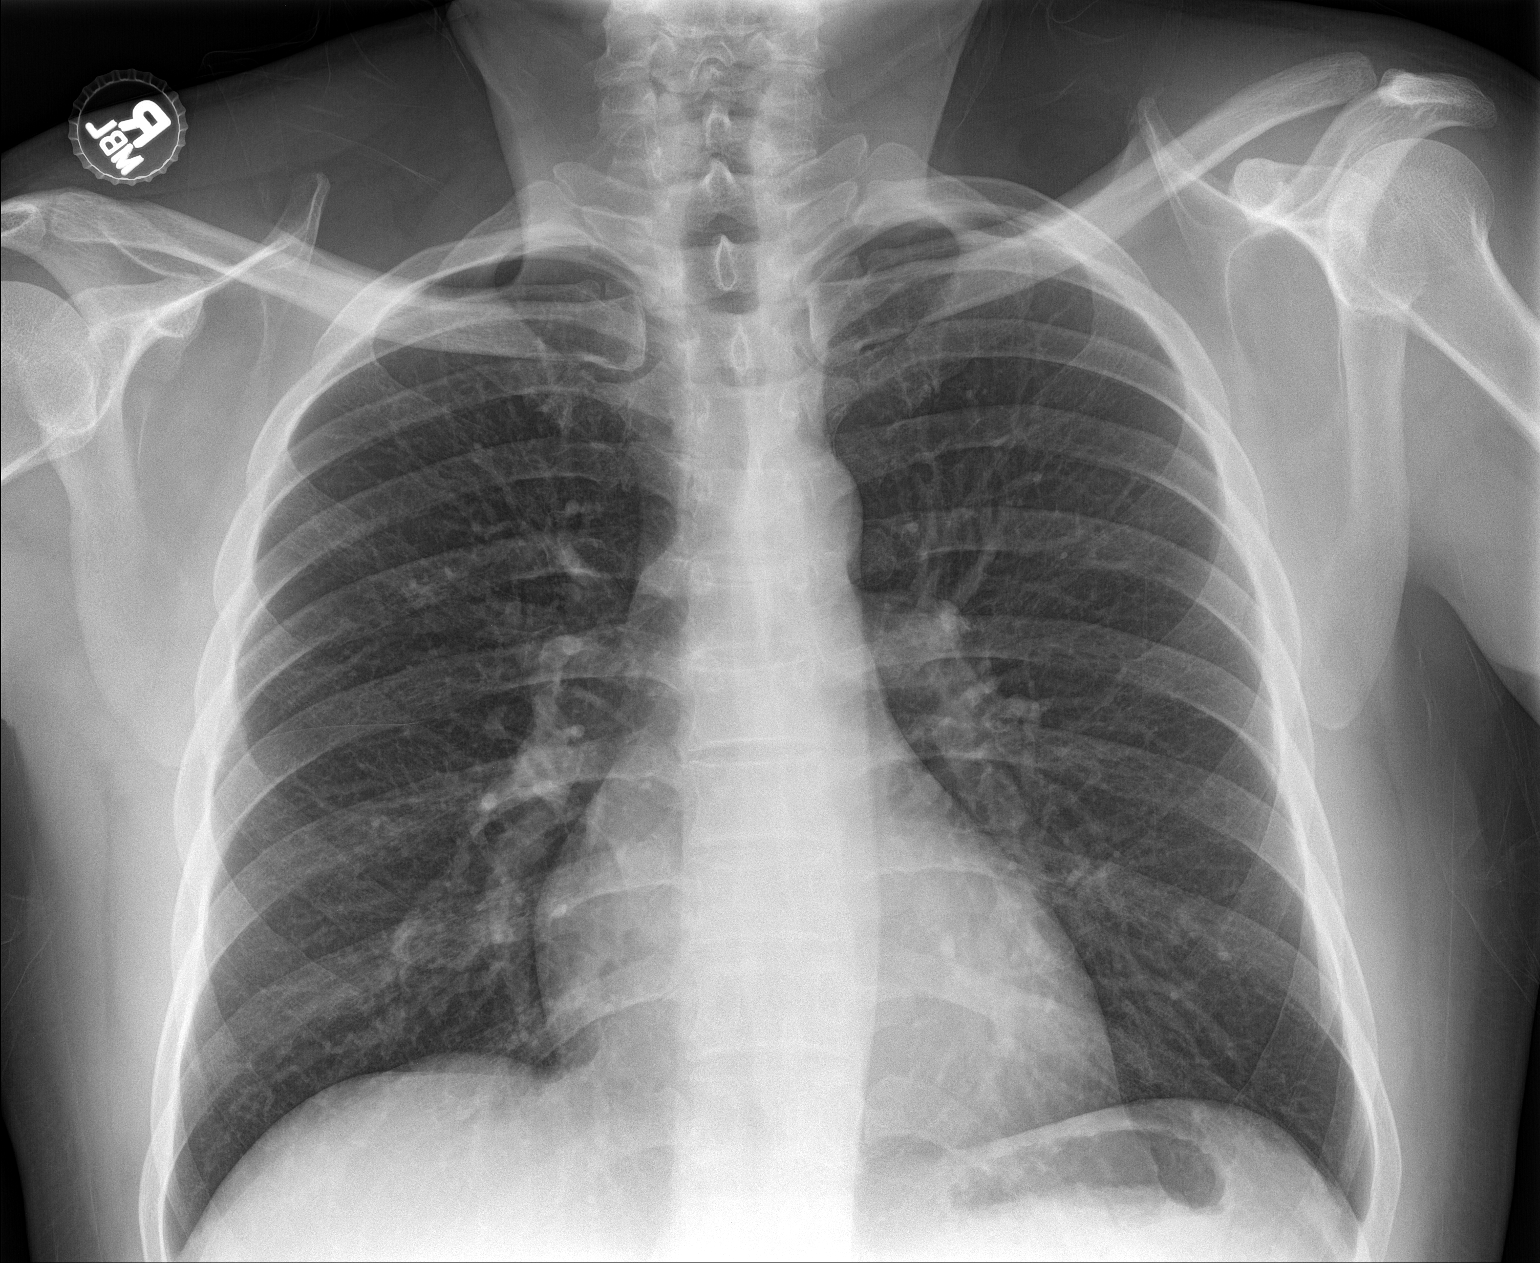

[rib obl]
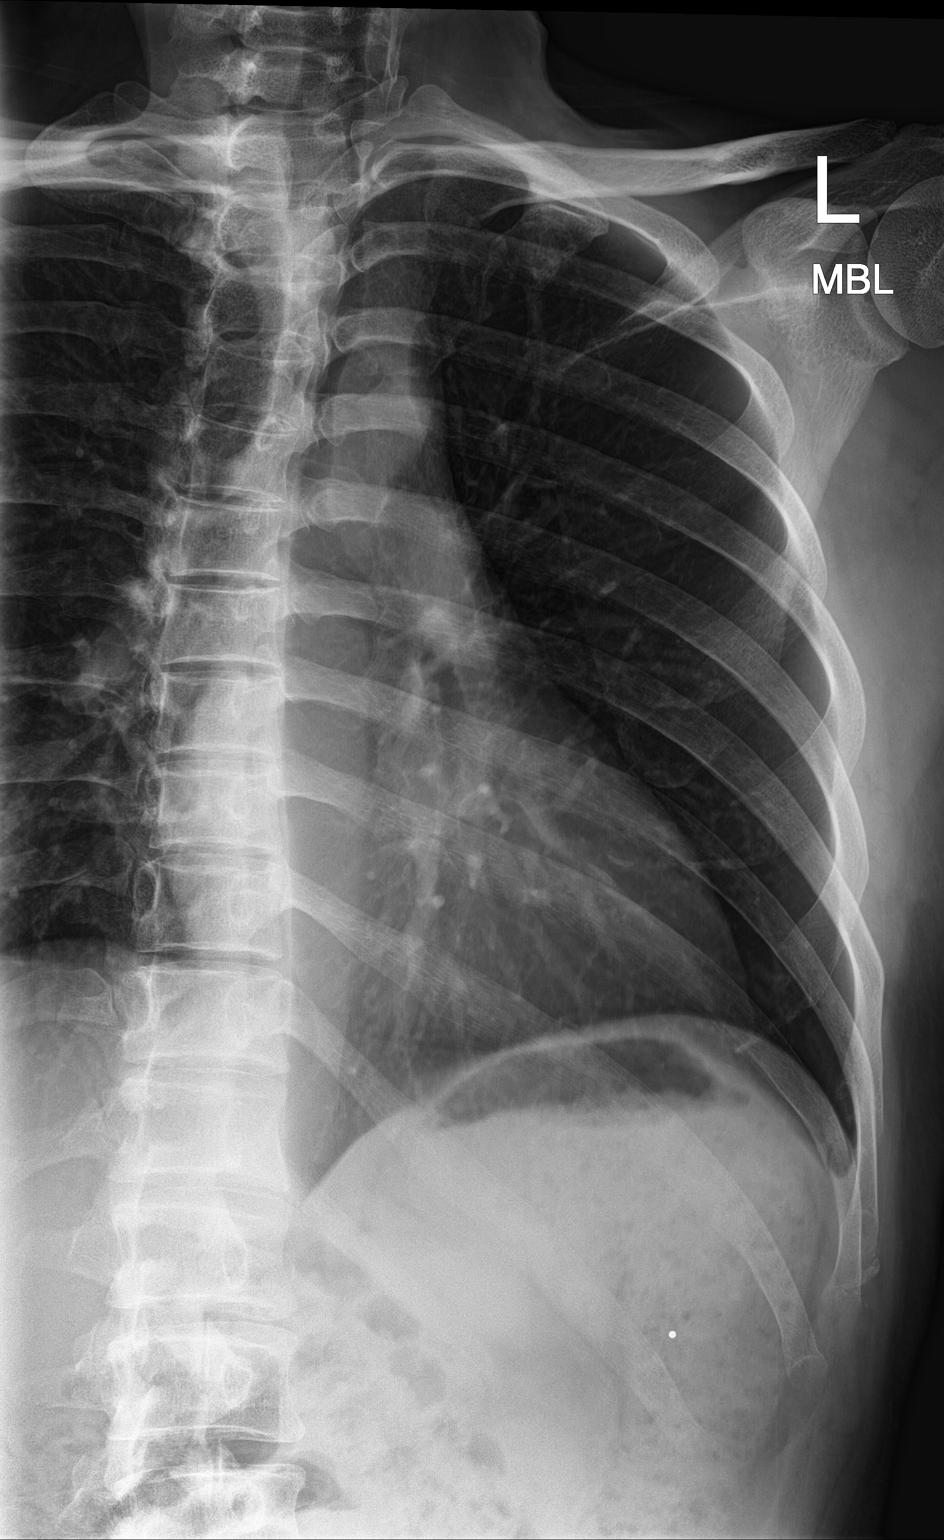

[rib pa]
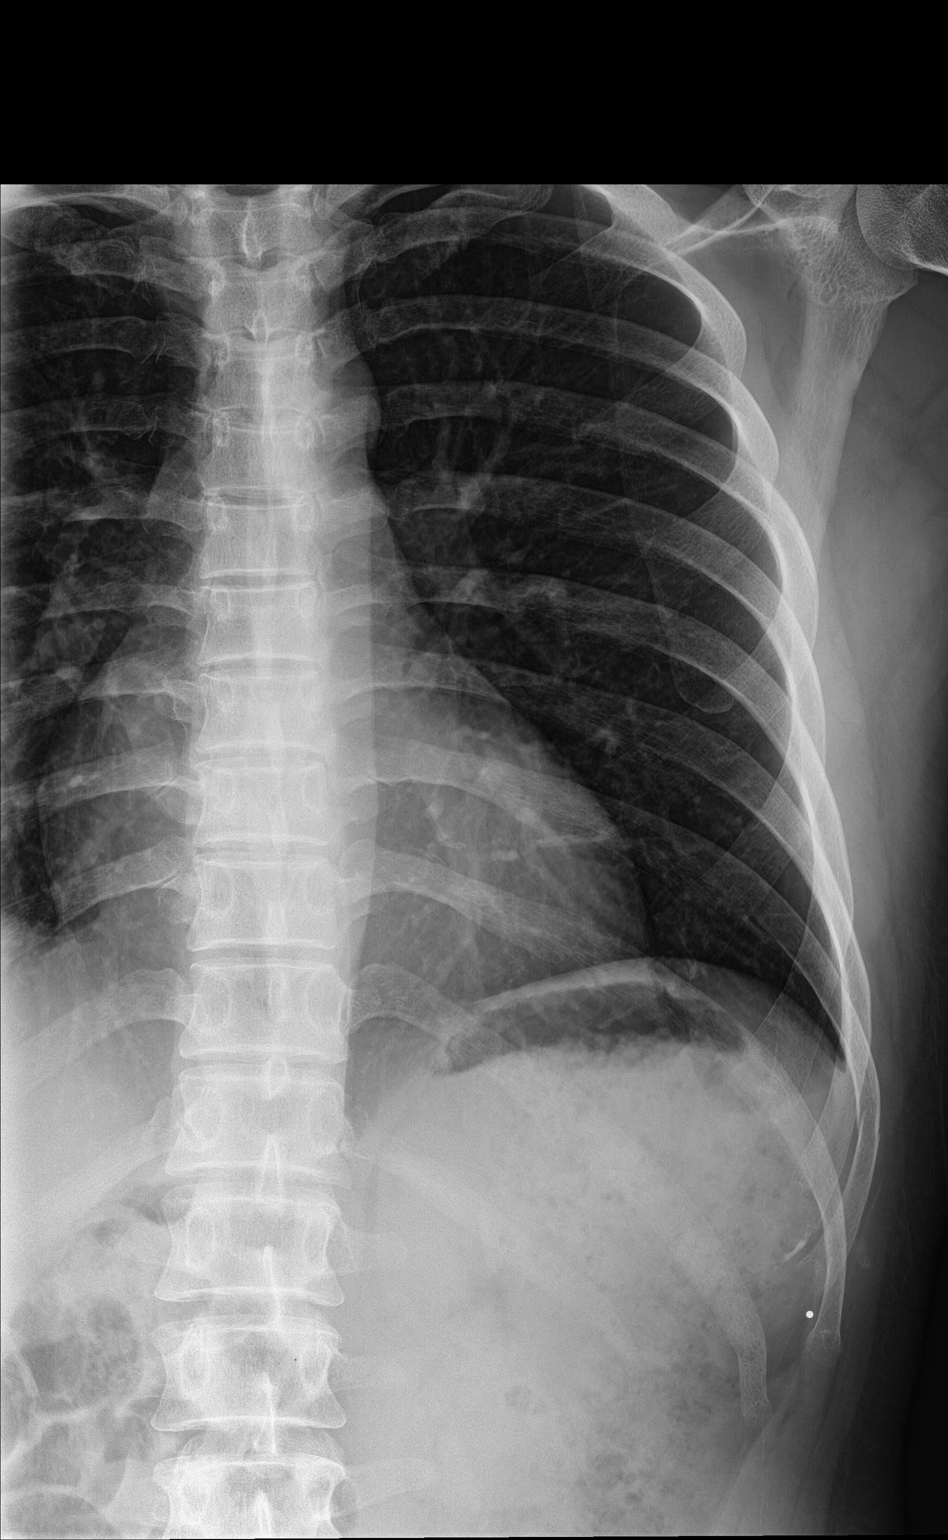

[3 of 3 positions shown; findings below may reference images not displayed]

FINDINGS: Minimally displaced left posterior ninth rib fracture, laterally. No
pneumothorax. Minimal pleural fluid on the left. Normal sized heart.
Clear lungs.
IMPRESSION: Minimally displaced left posterior ninth rib fracture with an
associated minimal hemothorax.

## 2021-06-11 ENCOUNTER — Encounter: Payer: Self-pay | Admitting: Gastroenterology

## 2021-06-30 ENCOUNTER — Ambulatory Visit (INDEPENDENT_AMBULATORY_CARE_PROVIDER_SITE_OTHER): Payer: BC Managed Care – PPO | Admitting: Gastroenterology

## 2021-06-30 ENCOUNTER — Other Ambulatory Visit (INDEPENDENT_AMBULATORY_CARE_PROVIDER_SITE_OTHER): Payer: BC Managed Care – PPO

## 2021-06-30 ENCOUNTER — Encounter: Payer: Self-pay | Admitting: Gastroenterology

## 2021-06-30 VITALS — BP 120/80 | HR 78 | Ht 64.0 in | Wt 192.0 lb

## 2021-06-30 DIAGNOSIS — R1032 Left lower quadrant pain: Secondary | ICD-10-CM | POA: Diagnosis not present

## 2021-06-30 DIAGNOSIS — Z1212 Encounter for screening for malignant neoplasm of rectum: Secondary | ICD-10-CM | POA: Diagnosis not present

## 2021-06-30 DIAGNOSIS — R3589 Other polyuria: Secondary | ICD-10-CM

## 2021-06-30 DIAGNOSIS — Z1211 Encounter for screening for malignant neoplasm of colon: Secondary | ICD-10-CM

## 2021-06-30 LAB — HEMOGLOBIN A1C: Hgb A1c MFr Bld: 5.6 % (ref 4.6–6.5)

## 2021-06-30 MED ORDER — PEG 3350-KCL-NA BICARB-NACL 420 G PO SOLR: 4000.0000 mL | Freq: Once | ORAL | 0 refills | Status: AC

## 2021-06-30 NOTE — Patient Instructions (Addendum)
If you are age 51 or older, your body mass index should be between 23-30. Your Body mass index is 32.96 kg/m. If this is out of the aforementioned range listed, please consider follow up with your Primary Care Provider.  If you are age 32 or younger, your body mass index should be between 19-25. Your Body mass index is 32.96 kg/m. If this is out of the aformentioned range listed, please consider follow up with your Primary Care Provider.   Your provider has requested that you go to the basement level for lab work before leaving today. Press "B" on the elevator. The lab is located at the first door on the left as you exit the elevator.   You have been scheduled for a CT scan of the abdomen and pelvis at [facility], 1st floor Radiology. You are scheduled on 07/07/21  at [place] should arrive 15 minutes prior to your appointment time for registration.  Please pick up 2 bottles of contrast from [facility] at least 3 days prior to your scan. The solution may taste better if refrigerated, but do NOT add ice or any other liquid to this solution. Shake well before drinking.   Please follow the written instructions below on the day of your exam:   1) Do not eat anything after 4am (4 hours prior to your test)   2) Drink 1 bottle of contrast @ 6am (2 hours prior to your exam)  Remember to shake well before drinking and do NOT pour over ice.     Drink 1 bottle of contrast @ 7am (1 hour prior to your exam)   You may take any medications as prescribed with a small amount of water, if necessary. If you take any of the following medications: METFORMIN, GLUCOPHAGE, GLUCOVANCE, AVANDAMET, RIOMET, FORTAMET, [place] MET, JANUMET, GLUMETZA or METAGLIP, you MAY be asked to HOLD this medication 48 hours AFTER the exam.   The purpose of you drinking the oral contrast is to aid in the visualization of your intestinal tract. The contrast solution may cause some diarrhea. Depending on your individual set of  symptoms, you may also receive an intravenous injection of x-ray contrast/dye. Plan on being at [facility] for 45 minutes or longer, depending on the type of exam you are having performed.   If you have any questions regarding your exam or if you need to reschedule, you may call [facility] at [phone] between the hours of 8:00 am and 5:00 pm, Monday-Friday.    You have been scheduled for a colonoscopy. Please follow written instructions given to you at your visit today.  Please pick up your prep supplies at the pharmacy within the next 1-3 days. If you use inhalers (even only as needed), please bring them with you on the day of your procedure.   The [facility] providers would like to encourage you to use [facility] to communicate with providers for non-urgent requests or questions.  Due to long hold times on the telephone, sending your provider a message by [facility] may be a faster and more efficient way to get a response.  Please allow 48 business hours for a response.  Please remember that this is for non-urgent requests.   Due to recent changes in healthcare laws, you may see the results of your imaging and laboratory studies on MyChart before your provider has had a chance to review them.  We understand that in some cases there may be results that are confusing  or concerning to you. Not all laboratory results come back in the same time frame and the provider may be waiting for multiple results in order to interpret others.  Please give [place] 48 hours in order for your provider to thoroughly review all the results before contacting the office for clarification of your results.    It was a pleasure to see you today!  Thank you for trusting me with your gastrointestinal care!

## 2021-06-30 NOTE — Progress Notes (Signed)
HPI : Darren Hahn is a very pleasant 51 year old male with no chronic medical history referred to [place] by Mertha Baars, PA-C for further evaluation of left lower quadrant pain.  The patient states that he started having left lower quadrant pain 2 months ago.  The pain started when he was working in the crawlspace of [employer] (he works as an Personnel officer).  He recalls twisting his body and feeling sudden pain in his left lower quadrant.  The pain has not improved since that time.  He also started having urinary symptoms shortly after this, characterized by urinary frequency and cloudy appearing urine.  He has been going to the bathroom every 2 hours on average, and wakes up twice a night to urinate.  He denies any burning with urination, no problems with hesitancy or weak stream.  He says the volume of urine is normal each time.  His bowel movements have been normal during this time.  He has 1-2 bowel movements a day.  Stools are typically soft and formed.  No problems with diarrhea, constipation or blood in the stool.  The abdominal pain does not seem to be related to eating or with bowel movements.  It is a constant dull ache, usually 3-4 out of 10.  Pain is not exacerbated with movement or activities.  His appetite is good, and his weight is stable.  He did not have any episodes of fevers, chills, nausea or vomiting with this pain. He has never had a colonoscopy.  He has no family history of colon cancer.  Evaluation by his referring provider included a urinalysis which showed moderate leukocytes.  Fingerstick blood glucose was 119.   No past medical history on file.   Past Surgical History:  Procedure Laterality Date   APPENDECTOMY     Family History  Problem Relation Age of Onset   Colon cancer Neg Hx    Esophageal cancer Neg Hx    Pancreatic cancer Neg Hx    Liver cancer Neg Hx    Social History   Tobacco Use   Smoking status: Never  Substance Use Topics   Alcohol use:  No   Current Outpatient Medications  Medication Sig Dispense Refill   acetaminophen (TYLENOL) 500 MG tablet Take 1,000 mg by mouth every 6 (six) hours as needed for moderate pain.     finasteride (PROSCAR) 5 MG tablet 5 mg daily.     No current facility-administered medications for this visit.   Allergies  Allergen Reactions   Tamsulosin     Other reaction(s): Other     Review of Systems: All systems reviewed and negative except where noted in HPI.    No results found.  Physical Exam: BP 120/80    Pulse 78    Ht 5\' 4"  (1.626 m)    Wt 192 lb (87.1 kg)    SpO2 98%    BMI 32.96 kg/m  Constitutional: Pleasant,well-developed, Hispanic male in no acute distress. HEENT: Normocephalic and atraumatic. Conjunctivae are normal. No scleral icterus. Neck supple.  Cardiovascular: Normal rate, regular rhythm.  Pulmonary/chest: Effort normal and breath sounds normal. No wheezing, rales or rhonchi. Abdominal: Soft, nondistended, focal tenderness to palpation in the left lower quadrant.  Pain is neither worsened or improved with flexion of the rectus abdominis muscles.  No rigidity or guarding.  Bowel sounds active throughout. There are no masses palpable. No hepatomegaly. Extremities: no edema Neurological: Alert and oriented to person place and time. Skin: Skin is warm and dry.  No rashes noted. Psychiatric: Normal mood and affect. Behavior is normal.  CBC    Component Value Date/Time   WBC 7.7 08/26/2013 0107   RBC 5.43 08/26/2013 0107   HGB 15.9 08/26/2013 0107   HCT 44.0 08/26/2013 0107   PLT 199 08/26/2013 0107   MCV 81.0 08/26/2013 0107   MCH 29.3 08/26/2013 0107   MCHC 36.1 (H) 08/26/2013 0107   RDW 13.1 08/26/2013 0107   LYMPHSABS 2.2 08/26/2013 0107   MONOABS 0.7 08/26/2013 0107   EOSABS 0.1 08/26/2013 0107   BASOSABS 0.0 08/26/2013 0107    CMP     Component Value Date/Time   NA 140 08/26/2013 0107   K 4.2 08/26/2013 0107   CL 98 08/26/2013 0107   CO2 29 08/26/2013  0107   GLUCOSE 100 (H) 08/26/2013 0107   BUN 21 08/26/2013 0107   CREATININE 0.81 08/26/2013 0107   CALCIUM 9.5 08/26/2013 0107   PROT 8.0 08/26/2013 0107   ALBUMIN 4.3 08/26/2013 0107   AST 29 08/26/2013 0107   ALT 29 08/26/2013 0107   ALKPHOS 81 08/26/2013 0107   BILITOT 0.5 08/26/2013 0107   GFRNONAA >90 08/26/2013 0107   GFRAA >90 08/26/2013 0107   Faxed medical records from referring provider show urinalysis with moderate leukocytes, nitrite negative, protein negative, blood negative, specific gravity 1.03, pH 6, fingerstick blood glucose 119  ASSESSMENT AND PLAN: 51 year old male with 2 months of persistent left lower quadrant pain without change in bowel habits or other GI symptoms.  He does have a change in urinary habits characterized by polyuria/frequency and urine discoloration.  Patient's report of pain starting abruptly while doing awkward work is most suggestive of musculoskeletal etiology for his pain.  He has focal tenderness on exam but is not worsened with abdominal flexion.  As the patient reports his urinary symptoms started around the same time his pain, and he has noted to have leukocytes in his urine, I think there is some concern for diverticulitis and colovesicular fistula, although his symptoms are by no means classic.  I would like to get a CT scan of the abdomen pelvis to assess for evidence of diverticulitis/fistulization.  Patient is also overdue for his initial average rescreening colonoscopy.  We will schedule him for this today.  As the patient had a mildly elevated fingerstick blood glucose and has symptoms of polyuria, we will screen him for diabetes with a hemoglobin A1c.  Recommend the patient follow-up in clinic after the studies are complete if pain is persistent.  We can consider abdominal wall injection if no other etiology is found and this pain is not improving.  In the meantime, recommend he try Tylenol and heat to help with this pain.  LLQ pain - CT  abdomen/pelvis - Tylenol, heating pads  Colon cancer screening -Colonoscopy  Urinary frequency -Hemoglobin A1c  The details, risks (including bleeding, perforation, infection, missed lesions, medication reactions and possible hospitalization or surgery if complications occur), benefits, and alternatives to colonoscopy with possible biopsy and possible polypectomy were discussed with the patient and he consents to proceed.   Brandy Zuba E. Tomasa Rand, MD [facility]  CC:  Mechele Dawley, PA-C

## 2021-07-07 ENCOUNTER — Ambulatory Visit (HOSPITAL_COMMUNITY): Payer: BC Managed Care – PPO

## 2021-08-17 ENCOUNTER — Encounter: Payer: Self-pay | Admitting: Gastroenterology

## 2021-08-23 ENCOUNTER — Ambulatory Visit (AMBULATORY_SURGERY_CENTER): Payer: BC Managed Care – PPO | Admitting: Gastroenterology

## 2021-08-23 ENCOUNTER — Encounter: Payer: Self-pay | Admitting: Gastroenterology

## 2021-08-23 ENCOUNTER — Other Ambulatory Visit: Payer: Self-pay

## 2021-08-23 VITALS — BP 107/71 | HR 58 | Temp 97.8°F | Resp 21 | Ht 64.0 in | Wt 192.0 lb

## 2021-08-23 DIAGNOSIS — Z1212 Encounter for screening for malignant neoplasm of rectum: Secondary | ICD-10-CM | POA: Diagnosis not present

## 2021-08-23 DIAGNOSIS — Z1211 Encounter for screening for malignant neoplasm of colon: Secondary | ICD-10-CM

## 2021-08-23 DIAGNOSIS — R1032 Left lower quadrant pain: Secondary | ICD-10-CM

## 2021-08-23 MED ORDER — SODIUM CHLORIDE 0.9 % IV SOLN: 500.0000 mL | Freq: Once | INTRAVENOUS | Status: DC

## 2021-08-23 NOTE — Progress Notes (Signed)
[facility] History and Physical ? ? ?Primary Care Physician:  Mechele Dawley, PA-C ? ? ?Reason for Procedure:   Colon cancer screening ? ?Plan:    Screening colonoscopy ? ? ? ? ?HPI: Darren Hahn is a 51 y.o. male undergoing initial average risk screening colonoscopy.  He has no family history of colon cancer.  He had been having problems with persistent left lower abdominal pain, but this appears to have resolved.  No diarrhea or hematochezia. ? ? ?History reviewed. No pertinent past medical history. ? ?Past Surgical History:  ?Procedure Laterality Date  ? APPENDECTOMY    ? ? ?Prior to Admission medications   ?Medication Sig Start Date End Date Taking? Authorizing Provider  ?acetaminophen (TYLENOL) 500 MG tablet Take 1,000 mg by mouth every 6 (six) hours as needed for moderate pain.    [provider]  ?finasteride (PROSCAR) 5 MG tablet 5 mg daily. ?Patient not taking: Reported on 08/23/2021    [provider]  ? ? ?Current Outpatient Medications  ?Medication Sig Dispense Refill  ? acetaminophen (TYLENOL) 500 MG tablet Take 1,000 mg by mouth every 6 (six) hours as needed for moderate pain.    ? finasteride (PROSCAR) 5 MG tablet 5 mg daily. (Patient not taking: Reported on 08/23/2021)    ? ?No current facility-administered medications for this visit.  ? ? ?Allergies as of 08/23/2021 - Review Complete 08/23/2021  ?Allergen Reaction Noted  ? Tamsulosin  06/30/2021  ? ? ?Family History  ?Problem Relation Age of Onset  ? Colon cancer Neg Hx   ? Esophageal cancer Neg Hx   ? Pancreatic cancer Neg Hx   ? Liver cancer Neg Hx   ? ? ?Social History  ? ?Socioeconomic History  ? Marital status: Married  ?  Spouse name: Not on file  ? Number of children: Not on file  ? Years of education: Not on file  ? Highest education level: Not on file  ?Occupational History  ? Not on file  ?Tobacco Use  ? Smoking status: Never  ? Smokeless tobacco: Not on file  ?Vaping Use  ? Vaping Use: Not on file   ?Substance and Sexual Activity  ? Alcohol use: No  ? Drug use: Never  ? Sexual activity: Never  ?Other Topics Concern  ? Not on file  ?Social History Narrative  ? Not on file  ? ?Social Determinants of Health  ? ?Financial Resource Strain: Not on file  ?Food Insecurity: Not on file  ?Transportation Needs: Not on file  ?Physical Activity: Not on file  ?Stress: Not on file  ?Social Connections: Not on file  ?Intimate Partner Violence: Not on file  ? ? ?Review of Systems: ? ?All other review of systems negative except as mentioned in the HPI. ? ?Physical Exam: ?Vital signs ?BP 107/61   Pulse 64   Temp 97.8 ?F (36.6 ?C) (Temporal)   Ht 5\' 4"  (1.626 m)   Wt 192 lb (87.1 kg)   SpO2 97%   BMI 32.96 kg/m?  ? ?General:   Alert,  Well-developed, well-nourished, pleasant and cooperative in NAD ?Airway:  Mallampati 2 ?Lungs:  Clear throughout to auscultation.   ?Heart:  Regular rate and rhythm; no murmurs, clicks, rubs,  or gallops. ?Abdomen:  Soft, nontender and nondistended. Normal bowel sounds.   ?Neuro/Psych:  Normal mood and affect. A and O x 3 ? ? ?Givanni Staron E. , MD ?[facility] ? ?

## 2021-08-23 NOTE — Op Note (Signed)
[facility] ?Patient Name: Darren Hahn ?Procedure Date: 08/23/2021 9:42 AM ?MRN: [id number] ?Endoscopist: Darren Hahn , MD ?Age: 51 ?Referring MD:  ?Date of Birth: 08/23/1970 ?Gender: Male ?Account #: [id number] ?Procedure:                Colonoscopy ?Indications:              Screening for colorectal malignant neoplasm, This  ?                          is the patient's first colonoscopy ?Medicines:                Monitored Anesthesia Care ?Procedure:                Pre-Anesthesia Assessment: ?                          - Prior to the procedure, a History and Physical  ?                          was performed, and patient medications and  ?                          allergies were reviewed. The patient's tolerance of  ?                          previous anesthesia was also reviewed. The risks  ?                          and benefits of the procedure and the sedation  ?                          options and risks were discussed with the patient.  ?                          All questions were answered, and informed consent  ?                          was obtained. Prior Anticoagulants: The patient has  ?                          taken no previous anticoagulant or antiplatelet  ?                          agents. ASA Grade Assessment: II - A patient with  ?                          mild systemic disease. After reviewing the risks  ?                          and benefits, the patient was deemed in  ?                          satisfactory condition to undergo the procedure. ?  After obtaining informed consent, the colonoscope  ?                          was passed under direct vision. Throughout the  ?                          procedure, the patient's blood pressure, pulse, and  ?                          oxygen saturations were monitored continuously. The  ?                          CF [id number] #[id number] was introduced through the anus  ?                          and advanced to  the the terminal ileum, with  ?                          identification of the appendiceal orifice and IC  ?                          valve. The colonoscopy was performed without  ?                          difficulty. The patient tolerated the procedure  ?                          well. The quality of the bowel preparation was  ?                          good. The terminal ileum, ileocecal valve,  ?                          appendiceal orifice, and rectum were photographed.  ?                          The bowel preparation used was GoLYTELY via split  ?                          dose instruction. ?Scope In: 10:35:30 AM ?Scope Out: 10:48:24 AM ?Scope Withdrawal Time: 0 hours 10 minutes 30 seconds  ?Total Procedure Duration: 0 hours 12 minutes 54 seconds  ?Findings:                 The perianal and digital rectal examinations were  ?                          normal. Pertinent negatives include normal  ?                          sphincter tone and no palpable rectal lesions. ?                          A few small-mouthed diverticula were found in the  ?  sigmoid colon. ?                          The exam was otherwise normal throughout the  ?                          examined colon. ?                          The terminal ileum appeared normal. ?                          The retroflexed view of the distal rectum and anal  ?                          verge was normal and showed no anal or rectal  ?                          abnormalities. ?Complications:            No immediate complications. ?Estimated Blood Loss:     Estimated blood loss: none. ?Impression:               - Diverticulosis in the sigmoid colon. ?                          - The examined portion of the ileum was normal. ?                          - The distal rectum and anal verge are normal on  ?                          retroflexion view. ?                          - No specimens collected. ?Recommendation:           - Patient has  a contact number available for  ?                          emergencies. The signs and symptoms of potential  ?                          delayed complications were discussed with the  ?                          patient. Return to normal activities tomorrow.  ?                          Written discharge instructions were provided to the  ?                          patient. ?                          - Resume previous diet. ?                          -  Continue present medications. ?                          - Repeat colonoscopy in 10 years for screening  ?                          purposes. ?Darren Loewe E. Tomasa Rand, MD ?08/23/2021 10:57:22 AM ?This report has been signed electronically. ?

## 2021-08-23 NOTE — Progress Notes (Signed)
PT taken to PACU. Monitors in place. VSS. Report given to RN. 

## 2021-08-23 NOTE — Patient Instructions (Signed)
Handout on diverticulosis given.   YOU HAD AN ENDOSCOPIC PROCEDURE TODAY AT THE [facility]:   Refer to the procedure report that was given to you for any specific questions about what was found during the examination.  If the procedure report does not answer your questions, please call your gastroenterologist to clarify.  If you requested that your care partner not be given the details of your procedure findings, then the procedure report has been included in a sealed envelope for you to review at your convenience later.  YOU SHOULD EXPECT: Some feelings of bloating in the abdomen. Passage of more gas than usual.  Walking can help get rid of the air that was put into your GI tract during the procedure and reduce the bloating. If you had a lower endoscopy (such as a colonoscopy or flexible sigmoidoscopy) you may notice spotting of blood in your stool or on the toilet paper. If you underwent a bowel prep for your procedure, you may not have a normal bowel movement for a few days.  Please Note:  You might notice some irritation and congestion in your nose or some drainage.  This is from the oxygen used during your procedure.  There is no need for concern and it should clear up in a day or so.  SYMPTOMS TO REPORT IMMEDIATELY:   Following lower endoscopy (colonoscopy or flexible sigmoidoscopy):  Excessive amounts of blood in the stool  Significant tenderness or worsening of abdominal pains  Swelling of the abdomen that is new, acute  Fever of 100F or higher   For urgent or emergent issues, a gastroenterologist can be reached at any hour by calling [phone]. Do not use MyChart messaging for urgent concerns.    DIET:  We do recommend a small meal at first, but then you may proceed to your regular diet.  Drink plenty of fluids but you should avoid alcoholic beverages for 24 hours.  ACTIVITY:  You should plan to take it easy for the rest of today and you should NOT DRIVE or use  heavy machinery until tomorrow (because of the sedation medicines used during the test).    FOLLOW UP: Our staff will call the number listed on your records 48-72 hours following your procedure to check on you and address any questions or concerns that you may have regarding the information given to you following your procedure. If we do not reach you, we will leave a message.  We will attempt to reach you two times.  During this call, we will ask if you have developed any symptoms of COVID 19. If you develop any symptoms (ie: fever, flu-like symptoms, shortness of breath, cough etc.) before then, please call [phone].  If you test positive for Covid 19 in the 2 weeks post procedure, please call and report this information to us.    If any biopsies were taken you will be contacted by phone or by letter within the next 1-3 weeks.  Please call us at [phone] if you have not heard about the biopsies in 3 weeks.    SIGNATURES/CONFIDENTIALITY: You and/or your care partner have signed paperwork which will be entered into your electronic medical record.  These signatures attest to the fact that that the information above on your After Visit Summary has been reviewed and is understood.  Full responsibility of the confidentiality of this discharge information lies with you and/or your care-partner. 

## 2021-08-25 ENCOUNTER — Telehealth: Payer: Self-pay

## 2021-08-25 NOTE — Telephone Encounter (Signed)
Follow up call placed, VM obtained and message left. ?SChaplin, RN,BSN ? ?

## 2024-06-12 ENCOUNTER — Other Ambulatory Visit: Payer: Self-pay

## 2024-06-12 ENCOUNTER — Emergency Department (HOSPITAL_COMMUNITY): Payer: Worker's Compensation

## 2024-06-12 ENCOUNTER — Encounter (HOSPITAL_COMMUNITY): Payer: Self-pay

## 2024-06-12 ENCOUNTER — Emergency Department (HOSPITAL_COMMUNITY)
Admission: EM | Admit: 2024-06-12 | Discharge: 2024-06-12 | Disposition: A | Discharge status: Home / Self Care | Payer: Worker's Compensation | Attending: Emergency Medicine | Admitting: Emergency Medicine

## 2024-06-12 DIAGNOSIS — W19XXXA Unspecified fall, initial encounter: Secondary | ICD-10-CM

## 2024-06-12 DIAGNOSIS — M545 Low back pain, unspecified: Secondary | ICD-10-CM | POA: Diagnosis present

## 2024-06-12 DIAGNOSIS — W11XXXA Fall on and from ladder, initial encounter: Secondary | ICD-10-CM | POA: Insufficient documentation

## 2024-06-12 MED ORDER — HYDROCODONE-ACETAMINOPHEN 5-325 MG PO TABS
1.0000 | ORAL_TABLET | Freq: Once | ORAL | Status: AC
  Administered 2024-06-12: 1 via ORAL
  Filled 2024-06-12: qty 1

## 2024-06-12 MED ORDER — CYCLOBENZAPRINE HCL 10 MG PO TABS: 10.0000 mg | ORAL_TABLET | Freq: Three times a day (TID) | ORAL | 0 refills | Status: AC

## 2024-06-12 MED ORDER — NAPROXEN 500 MG PO TABS: 500.0000 mg | ORAL_TABLET | Freq: Two times a day (BID) | ORAL | 0 refills | Status: AC

## 2024-06-12 NOTE — ED Triage Notes (Signed)
 Patient fell 68ft off ladder landing on buttock and complains of sacral pain and no other injuries

## 2024-06-12 NOTE — ED Provider Notes (Signed)
 " [place] EMERGENCY DEPARTMENT AT [facility] Provider Note   CSN: [id number] Arrival date & time: 06/12/24  1603     Patient presents with: Darren Hahn is a 54 y.o. male.   54 y.o male with no PMH presents to the ED via EMS s/p fall. Patient was up on a 8 foot on the sixth step when suddenly the ladder slipped. He reports falling and landing on his buttock. He is having severe pain to his sacrum, exacerbated with sitting down and ambulating. He has not received any medication to help with pain control.  He denies any numbness or tingling to his lower or upper extremities.  He did void while in the emergency department without any gross hematuria noted.  No other complaints reported.  The history is provided by the patient.       Prior to Admission medications  Medication Sig Start Date End Date Taking? Authorizing Provider  cyclobenzaprine  (FLEXERIL ) 10 MG tablet Take 1 tablet (10 mg total) by mouth 3 (three) times daily for 7 days. 06/12/24 06/19/24 Yes Jarion Hawthorne, PA-C  naproxen  (NAPROSYN ) 500 MG tablet Take 1 tablet (500 mg total) by mouth 2 (two) times daily for 7 days. 06/12/24 06/19/24 Yes Shellye Zandi, PA-C  acetaminophen  (TYLENOL ) 500 MG tablet Take 1,000 mg by mouth every 6 (six) hours as needed for moderate pain.    [provider]  finasteride (PROSCAR) 5 MG tablet 5 mg daily. Patient not taking: Reported on 08/23/2021    [provider]    Allergies: Tamsulosin    Review of Systems  Constitutional:  Negative for fever.  Respiratory:  Negative for shortness of breath.   Musculoskeletal:  Positive for back pain and myalgias. Negative for joint swelling.    Updated Vital Signs BP 132/78   Pulse 73   Temp 98.2 F (36.8 C)   Resp 16   Ht 5' 4 (1.626 m)   Wt 87.1 kg   SpO2 100%   BMI 32.96 kg/m   Physical Exam Vitals and nursing note reviewed.  Constitutional:      Appearance: Normal appearance.  HENT:     Head:  Normocephalic and atraumatic.     Mouth/Throat:     Mouth: Mucous membranes are moist.  Cardiovascular:     Rate and Rhythm: Normal rate.  Pulmonary:     Effort: Pulmonary effort is normal.  Abdominal:     General: Abdomen is flat.  Musculoskeletal:     Cervical back: Normal range of motion and neck supple.       Legs:     Comments: Ambulatory in the ED.   Skin:    General: Skin is warm and dry.  Neurological:     Mental Status: He is alert and oriented to person, place, and time.     Comments: RLE- KF,KE 5/5 strength LLE- HF, HE 5/5 strength antalgic gait. No pronator drift. No leg drop.  CN I, II and VIII not tested. CN II-XII grossly intact bilaterally.         (all labs ordered are listed, but only abnormal results are displayed) Labs Reviewed  URINALYSIS, ROUTINE W REFLEX MICROSCOPIC    EKG: None  Radiology: CT PELVIS WO CONTRAST Result Date: 06/12/2024 EXAM: CT PELVIS, WITHOUT IV CONTRAST 06/12/2024 04:53:25 PM TECHNIQUE: Axial images were acquired through the pelvis without IV contrast. Reformatted images were reviewed. Automated exposure control, iterative reconstruction, and/or weight based adjustment of the mA/kV was utilized to  reduce the radiation dose to as low as reasonably achievable. COMPARISON: None available. CLINICAL HISTORY: Hip trauma, fracture suspected, no prior imaging. FINDINGS: BONES: No acute fracture or focal osseous lesion. JOINTS: No dislocation. The joint spaces are normal. SOFT TISSUES: The soft tissues are unremarkable. INTRAPELVIC CONTENTS: The prostate is unremarkable. Status post appendectomy. Limited images of the intrapelvic contents demonstrate no acute abnormality. IMPRESSION: 1. No acute osseous abnormality. Electronically signed by: Morgane Naveau MD 06/12/2024 05:11 PM EST RP Workstation: [id number]   CT Thoracic Spine Wo Contrast Result Date: 06/12/2024 EXAM: CT THORACIC SPINE WITHOUT CONTRAST 06/12/2024 04:53:25 PM TECHNIQUE: CT of  the thoracic spine was performed without the administration of intravenous contrast. Multiplanar reformatted images are provided for review. Automated exposure control, iterative reconstruction, and/or weight based adjustment of the mA/kV was utilized to reduce the radiation dose to as low as reasonably achievable. COMPARISON: None available. CLINICAL HISTORY: Back trauma, no prior imaging (Age >= 16y). FINDINGS: BONES AND ALIGNMENT: Normal vertebral body heights. No acute fracture or suspicious bone lesion. Normal alignment. DEGENERATIVE CHANGES: No significant degenerative changes. SOFT TISSUES: Mild atherosclerotic plaque. IMPRESSION: 1. No acute abnormality of the thoracic spine related to back trauma. Electronically signed by: Morgane Naveau MD 06/12/2024 05:09 PM EST RP Workstation: [id number]   CT Lumbar Spine Wo Contrast Result Date: 06/12/2024 EXAM: CT OF THE LUMBAR SPINE WITHOUT CONTRAST 06/12/2024 04:53:25 PM TECHNIQUE: CT of the lumbar spine was performed without the administration of intravenous contrast. Multiplanar reformatted images are provided for review. Automated exposure control, iterative reconstruction, and/or weight based adjustment of the mA/kV was utilized to reduce the radiation dose to as low as reasonably achievable. COMPARISON: None available. CLINICAL HISTORY: Low back pain, trauma. FINDINGS: BONES AND ALIGNMENT: Normal vertebral body heights. No acute fracture or suspicious bone lesion. Normal alignment. DEGENERATIVE CHANGES: Intervertebral disc space vacuum phenomenon. SOFT TISSUES: No acute abnormality. VASCULATURE: Mild atherosclerotic plaque. IMPRESSION: 1. No evidence of acute traumatic injury. Electronically signed by: Morgane Naveau MD 06/12/2024 05:07 PM EST RP Workstation: [id number]   CT Cervical Spine Wo Contrast Result Date: 06/12/2024 EXAM: CT CERVICAL SPINE WITHOUT CONTRAST 06/12/2024 04:53:25 PM TECHNIQUE: CT of the cervical spine was performed without the  administration of intravenous contrast. Multiplanar reformatted images are provided for review. Automated exposure control, iterative reconstruction, and/or weight based adjustment of the mA/kV was utilized to reduce the radiation dose to as low as reasonably achievable. COMPARISON: None available. CLINICAL HISTORY: Facial trauma, blunt. FINDINGS: BONES AND ALIGNMENT: No acute fracture or traumatic malalignment. DEGENERATIVE CHANGES: Multilevel mild degenerative changes of the spine. SOFT TISSUES: No prevertebral soft tissue swelling. IMPRESSION: 1. No evidence of acute traumatic injury. Electronically signed by: Morgane Naveau MD 06/12/2024 05:06 PM EST RP Workstation: [id number]     Procedures   Medications Ordered in the ED  HYDROcodone -acetaminophen  (NORCO/VICODIN) 5-325 MG per tablet 1 tablet (1 tablet Oral Given 06/12/24 1702)                                    Medical Decision Making Amount and/or Complexity of Data Reviewed Labs: ordered. Radiology: ordered.  Risk Prescription drug management.    Patient presented to the ED status post fall from on 8 foot ladder after stepping up 6 steps.  He endorses pain along the sacrum, exacerbated with weightbearing along with sitting down.  He has not taken anything for improvement in symptoms.  He reports he did not strike  his head, there was no loss of conscious, he is not on any blood thinners.  He is not having any numbness or tingling to his legs.  He voided in the ED without any gross hematuria present.  He denies any other complaints at this time.  CT of the cervical spine, thoracic spine, lumbar spine and pelvis were obtained to identify any acute spinal injury.  All CT results were negative at this time.  He is ambulatory in the ED, given Norco for pain control with improvement in his symptoms.  Patient will be sent home with a short course of muscle relaxers and anti-inflammatories to help with his symptoms.  He is agreeable to plan  and treatment, hemodynamically stable for discharge.    Portions of this note were generated with Scientist, clinical (histocompatibility and immunogenetics). Dictation errors may occur despite best attempts at proofreading.   Final diagnoses:  Fall, initial encounter  Lumbar spine pain    ED Discharge Orders          Ordered    naproxen  (NAPROSYN ) 500 MG tablet  2 times daily        06/12/24 1734    cyclobenzaprine  (FLEXERIL ) 10 MG tablet  3 times daily        06/12/24 1734               Colisha Redler, PA-C 06/12/24 1737    Ruthe Cornet, DO 06/12/24 1828  "

## 2024-06-12 NOTE — ED Notes (Signed)
 Pt was given discharge instructions and drug screen for workers comp completed.

## 2024-06-12 NOTE — Discharge Instructions (Signed)
 Le he recetado [employer]. La primera es naproxen , esta medicina es para la inflamacion.   La segunda medicina es flexeril , tome una pastilla 3 veces al dia. Esta pastilla puede causar mareos, no trabaje o maneje cuando este tomando [id number]
